# Patient Record
Sex: Female | Born: 1970 | Hispanic: Yes | Marital: Married | Smoking: Never smoker
Health system: Southern US, Community
[De-identification: ages and names within clinical notes are randomized; demographics above are authoritative.]

---

## 2006-02-21 ENCOUNTER — Emergency Department: Payer: Self-pay | Admitting: Emergency Medicine

## 2006-06-30 ENCOUNTER — Emergency Department: Payer: Self-pay | Admitting: Emergency Medicine

## 2006-10-16 ENCOUNTER — Ambulatory Visit: Payer: Self-pay | Admitting: Obstetrics and Gynecology

## 2015-01-03 ENCOUNTER — Ambulatory Visit
Admission: RE | Admit: 2015-01-03 | Discharge: 2015-01-03 | Disposition: A | Discharge status: Home / Self Care | Payer: 59 | Source: Ambulatory Visit | Attending: Family Medicine | Admitting: Family Medicine

## 2015-01-03 ENCOUNTER — Other Ambulatory Visit: Payer: Self-pay | Admitting: Family Medicine

## 2015-01-03 DIAGNOSIS — Z1231 Encounter for screening mammogram for malignant neoplasm of breast: Secondary | ICD-10-CM

## 2019-06-12 ENCOUNTER — Emergency Department
Admission: EM | Admit: 2019-06-12 | Discharge: 2019-06-12 | Disposition: A | Discharge status: Home / Self Care | Payer: No Typology Code available for payment source | Attending: Emergency Medicine | Admitting: Emergency Medicine

## 2019-06-12 ENCOUNTER — Emergency Department: Payer: No Typology Code available for payment source

## 2019-06-12 ENCOUNTER — Other Ambulatory Visit: Payer: Self-pay

## 2019-06-12 ENCOUNTER — Encounter: Payer: Self-pay | Admitting: Emergency Medicine

## 2019-06-12 DIAGNOSIS — T7411XA Adult physical abuse, confirmed, initial encounter: Secondary | ICD-10-CM | POA: Insufficient documentation

## 2019-06-12 DIAGNOSIS — Y9289 Other specified places as the place of occurrence of the external cause: Secondary | ICD-10-CM | POA: Insufficient documentation

## 2019-06-12 DIAGNOSIS — Y998 Other external cause status: Secondary | ICD-10-CM | POA: Insufficient documentation

## 2019-06-12 DIAGNOSIS — Y9389 Activity, other specified: Secondary | ICD-10-CM | POA: Diagnosis not present

## 2019-06-12 DIAGNOSIS — S022XXA Fracture of nasal bones, initial encounter for closed fracture: Secondary | ICD-10-CM | POA: Insufficient documentation

## 2019-06-12 DIAGNOSIS — S0990XA Unspecified injury of head, initial encounter: Secondary | ICD-10-CM | POA: Diagnosis present

## 2019-06-12 DIAGNOSIS — T07XXXA Unspecified multiple injuries, initial encounter: Secondary | ICD-10-CM

## 2019-06-12 MED ORDER — TRAMADOL HCL 50 MG PO TABS: 50.0000 mg | ORAL_TABLET | Freq: Four times a day (QID) | ORAL | 0 refills | Status: AC | PRN

## 2019-06-12 MED ORDER — TETANUS-DIPHTH-ACELL PERTUSSIS 5-2.5-18.5 LF-MCG/0.5 IM SUSP
0.5000 mL | Freq: Once | INTRAMUSCULAR | Status: AC
  Administered 2019-06-12: 0.5 mL via INTRAMUSCULAR
  Filled 2019-06-12: qty 0.5
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
## 2019-06-12 NOTE — Discharge Instructions (Signed)
Follow-up with  ENT if any continued problems with your nose.  Ice to your nose as needed for pain and swelling.  Take the pain medication only as directed.  After finishing the pain medication you may take over-the-counter Tylenol or Advil.  Do not take the pain medication and mix with alcohol.  Return to the emergency department if any severe worsening of your symptoms.
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
## 2022-04-08 ENCOUNTER — Emergency Department: Payer: No Typology Code available for payment source

## 2022-04-08 ENCOUNTER — Encounter: Payer: Self-pay | Admitting: *Deleted

## 2022-04-08 ENCOUNTER — Emergency Department
Admission: EM | Admit: 2022-04-08 | Discharge: 2022-04-08 | Disposition: A | Discharge status: Home / Self Care | Payer: No Typology Code available for payment source | Attending: Emergency Medicine | Admitting: Emergency Medicine

## 2022-04-08 ENCOUNTER — Other Ambulatory Visit: Payer: Self-pay

## 2022-04-08 DIAGNOSIS — W458XXA Other foreign body or object entering through skin, initial encounter: Secondary | ICD-10-CM | POA: Diagnosis not present

## 2022-04-08 DIAGNOSIS — S50852A Superficial foreign body of left forearm, initial encounter: Secondary | ICD-10-CM | POA: Diagnosis not present

## 2022-04-08 MED ORDER — SULFAMETHOXAZOLE-TRIMETHOPRIM 800-160 MG PO TABS: 1.0000 | ORAL_TABLET | Freq: Two times a day (BID) | ORAL | 0 refills | Status: AC

## 2022-04-08 MED ORDER — TETANUS-DIPHTH-ACELL PERTUSSIS 5-2.5-18.5 LF-MCG/0.5 IM SUSP: 0.5000 mL | Freq: Once | INTRAMUSCULAR | 0 refills | Status: AC

## 2022-04-08 MED ORDER — LIDOCAINE HCL (PF) 1 % IJ SOLN
5.0000 mL | Freq: Once | INTRAMUSCULAR | Status: AC
  Administered 2022-04-08: 5 mL
  Filled 2022-04-08: qty 5

## 2022-04-08 MED ORDER — TETANUS-DIPHTH-ACELL PERTUSSIS 5-2.5-18.5 LF-MCG/0.5 IM SUSY
0.5000 mL | PREFILLED_SYRINGE | Freq: Once | INTRAMUSCULAR | Status: DC
  Filled 2022-04-08: qty 0.5

## 2022-04-08 MED ORDER — ACETAMINOPHEN 500 MG PO TABS
1000.0000 mg | ORAL_TABLET | Freq: Once | ORAL | Status: AC
  Administered 2022-04-08: 1000 mg via ORAL
  Filled 2022-04-08: qty 2

## 2022-04-08 MED ORDER — SULFAMETHOXAZOLE-TRIMETHOPRIM 800-160 MG PO TABS
1.0000 | ORAL_TABLET | Freq: Once | ORAL | Status: AC
  Administered 2022-04-08: 1 via ORAL
  Filled 2022-04-08: qty 1

## 2022-04-08 NOTE — ED Triage Notes (Signed)
Pt has a knitting needle lodged in left forearm  states WC.  No bleeding.  Pt alert  speech clear.

## 2022-04-08 NOTE — ED Notes (Signed)
Pt Dc to home. Dc instructions reviewed with all questions answered. Pt reports Tdap given by previous nurse, no reaction noted. Pt verbalizes understanding  of DC instructions.  Pt ambulatory out of dept with steady gait.

## 2022-04-08 NOTE — ED Notes (Signed)
Provider TG notified in person that pt hasn't updated tetanus in over 10 years.

## 2022-04-08 NOTE — Discharge Instructions (Signed)
Please take antibiotics as prescribed.  Return to the ER for any increasing pain swelling warmth redness or drainage.

## 2022-04-08 NOTE — ED Provider Notes (Signed)
[facility] EMERGENCY DEPARTMENT Provider Note   CSN: [id number] Arrival date & time: 04/08/22  2101     History  Chief Complaint  Patient presents with   Foreign Body    Julia Kane is a 51 y.o. female.  Presents to the emergency department for evaluation of foreign body to the left forearm.  Patient states she was at work earlier today when a knitting needle was lodged into her left forearm.  Patient states the needle needle was on the table and she slit her forearm over top of the knitting needle and suffered foreign body to the left forearm.  Knitting needle partially embedded into the soft tissue, unable to be removed due to a hook on the end of the needle.  Patient states she is having very minimal pain.  She denies any numbness or tingling or limitation in digit range of motion.  Her tetanus is not up-to-date.  HPI     Home Medications Prior to Admission medications   Medication Sig Start Date End Date Taking? Authorizing Provider  sulfamethoxazole-trimethoprim (BACTRIM DS) 800-160 MG tablet Take 1 tablet by mouth 2 (two) times daily for 7 days. 04/08/22 04/15/22 Yes Evon Slack, PA-C  Tdap (BOOSTRIX) 5-2.5-18.5 LF-MCG/0.5 injection Inject 0.5 mLs into the muscle once for 1 dose. 04/08/22 04/08/22 Yes Evon Slack, PA-C  traMADol (ULTRAM) 50 MG tablet Take 1 tablet (50 mg total) by mouth every 6 (six) hours as needed for moderate pain. 06/12/19   Tommi Rumps, PA-C      Allergies    Penicillins    Review of Systems   Review of Systems  Physical Exam Updated Vital Signs BP (!) 135/90 (BP Location: Right Arm)   Pulse 73   Temp 97.9 F (36.6 C) (Oral)   Resp 18   Ht 5\' 2"  (1.575 m)   Wt 56.7 kg   LMP 04/06/2022 (Approximate)   SpO2 95%   BMI 22.86 kg/m  Physical Exam Constitutional:      Appearance: She is well-developed.  HENT:     Head: Normocephalic and atraumatic.  Eyes:     Conjunctiva/sclera: Conjunctivae normal.   Cardiovascular:     Rate and Rhythm: Normal rate.  Pulmonary:     Effort: Pulmonary effort is normal. No respiratory distress.  Musculoskeletal:        General: Normal range of motion.     Cervical back: Normal range of motion.     Comments: Left upper extremity with foreign body into the left forearm, this is along the volar aspect of the forearm and is superficial, sensation is intact distally.  No tendon deficits noted.  Skin:    General: Skin is warm.     Findings: No rash.  Neurological:     General: No focal deficit present.     Mental Status: She is alert and oriented to person, place, and time.  Psychiatric:        Behavior: Behavior normal.        Thought Content: Thought content normal.     ED Results / Procedures / Treatments   Labs (all labs ordered are listed, but only abnormal results are displayed) Labs Reviewed - No data to display  EKG None  Radiology DG Forearm Left  Result Date: 04/08/2022 CLINICAL DATA:  Post foreign body removal EXAM: LEFT FOREARM - 2 VIEW COMPARISON:  None Available. FINDINGS: There is no evidence of fracture or other focal bone lesions. There is soft tissue swelling  of the posterior mid forearm. There is no radiopaque foreign body. IMPRESSION: No foreign body identified. Electronically Signed   By: Darliss Cheney M.D.   On: 04/08/2022 22:26   DG Forearm Left  Result Date: 04/08/2022 CLINICAL DATA:  Foreign body. EXAM: LEFT FOREARM - 2 VIEW COMPARISON:  None Available. FINDINGS: Metallic instrument overlies the mid forearm. No evidence for additional radiopaque foreign bodies. No acute fracture or dislocation. Soft tissues are within normal limits. IMPRESSION: 1. No foreign body identified. 2. No acute fracture or dislocation. Electronically Signed   By: Darliss Cheney M.D.   On: 04/08/2022 21:39    Procedures .Foreign Body Removal  Date/Time: 04/08/2022 10:01 PM  Performed by: Evon Slack, PA-C Authorized by: Evon Slack,  PA-C  Consent: Verbal consent obtained. Consent given by: patient Patient identity confirmed: verbally with patient Body area: skin General location: upper extremity Location details: left forearm Anesthesia: local infiltration  Anesthesia: Local Anesthetic: lidocaine 1% without epinephrine Anesthetic total: 3 mL  Sedation: Patient sedated: no  Patient restrained: no Removal mechanism: As lidocaine was injected along the shaft of the needle which was embedded into the soft tissue, the knitting needle was rotated and slowly extracted. Dressing: dressing applied Tendon involvement: none Complexity: simple Post-procedure assessment: foreign body removed      Medications Ordered in ED Medications  Tdap (BOOSTRIX) injection 0.5 mL (has no administration in time range)  lidocaine (PF) (XYLOCAINE) 1 % injection 5 mL (5 mLs Infiltration Given by Other 04/08/22 2136)  sulfamethoxazole-trimethoprim (BACTRIM DS) 800-160 MG per tablet 1 tablet (1 tablet Oral Given 04/08/22 2153)  acetaminophen (TYLENOL) tablet 1,000 mg (1,000 mg Oral Given 04/08/22 2152)    ED Course/ Medical Decision Making/ A&P                           Medical Decision Making Amount and/or Complexity of Data Reviewed Radiology: ordered.  Risk OTC drugs. Prescription drug management.   51 year old female with work comp injury to the left forearm.  She suffered a foreign body with a penetrating needle present.  Tetanus updated.  Patient placed on prophylactic antibiotics.  Needle was successfully removed with no complications.  Patient tolerated procedure well.  Post foreign body removal films show no evidence of foreign body present.  We will be placed on prophylactic antibiotics and will be able to return to work.  Band-Aid was applied.  Patient doing well.  She understands signs symptoms return to the ER for.  She will follow-up with work comp/Pletal. Final Clinical Impression(s) / ED Diagnoses Final diagnoses:   Foreign body in left forearm, initial encounter    Rx / DC Orders ED Discharge Orders          Ordered    Tdap (BOOSTRIX) 5-2.5-18.5 LF-MCG/0.5 injection   Once        04/08/22 2156    sulfamethoxazole-trimethoprim (BACTRIM DS) 800-160 MG tablet  2 times daily        04/08/22 2158              Ronnette Juniper 04/08/22 2234    Dionne Bucy, MD 04/08/22 2307

## 2022-04-08 NOTE — ED Notes (Signed)
See triage note. Foreign object at bedside with pt; family at bedside; puncture mark to L fa noted; pt able to move L arm appropriately; no major swelling or bleeding currently noted; pt in NAD.

## 2023-07-21 ENCOUNTER — Ambulatory Visit
Admission: EM | Admit: 2023-07-21 | Discharge: 2023-07-21 | Disposition: A | Discharge status: Home / Self Care | Payer: No Typology Code available for payment source | Attending: Emergency Medicine | Admitting: Emergency Medicine

## 2023-07-21 ENCOUNTER — Ambulatory Visit: Payer: No Typology Code available for payment source

## 2023-07-21 DIAGNOSIS — M25512 Pain in left shoulder: Secondary | ICD-10-CM

## 2023-07-21 MED ORDER — IBUPROFEN 600 MG PO TABS: 600.0000 mg | ORAL_TABLET | Freq: Four times a day (QID) | ORAL | 0 refills | Status: AC | PRN

## 2023-07-21 NOTE — Discharge Instructions (Addendum)
Take the ibuprofen as directed.  Rest your shoulder.  Wear the sling as directed.  Apply ice packs as directed.  Follow-up with an orthopedist if your symptoms are not improving.
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
